# Patient Record
Sex: Female | Born: 1965 | Hispanic: No | Smoking: Never smoker
Health system: Southern US, Community
[De-identification: ages and names within clinical notes are randomized; demographics above are authoritative.]

## PROBLEM LIST (undated history)

## (undated) DIAGNOSIS — M5126 Other intervertebral disc displacement, lumbar region: Secondary | ICD-10-CM

## (undated) DIAGNOSIS — M5020 Other cervical disc displacement, unspecified cervical region: Secondary | ICD-10-CM

## (undated) DIAGNOSIS — M25811 Other specified joint disorders, right shoulder: Secondary | ICD-10-CM

## (undated) DIAGNOSIS — M51369 Other intervertebral disc degeneration, lumbar region without mention of lumbar back pain or lower extremity pain: Secondary | ICD-10-CM

## (undated) DIAGNOSIS — F329 Major depressive disorder, single episode, unspecified: Secondary | ICD-10-CM

## (undated) DIAGNOSIS — I10 Essential (primary) hypertension: Secondary | ICD-10-CM

## (undated) DIAGNOSIS — N950 Postmenopausal bleeding: Secondary | ICD-10-CM

## (undated) DIAGNOSIS — N9489 Other specified conditions associated with female genital organs and menstrual cycle: Secondary | ICD-10-CM

## (undated) DIAGNOSIS — F32A Depression, unspecified: Secondary | ICD-10-CM

## (undated) DIAGNOSIS — M1990 Unspecified osteoarthritis, unspecified site: Secondary | ICD-10-CM

## (undated) DIAGNOSIS — R9389 Abnormal findings on diagnostic imaging of other specified body structures: Secondary | ICD-10-CM

## (undated) DIAGNOSIS — F411 Generalized anxiety disorder: Secondary | ICD-10-CM

## (undated) DIAGNOSIS — F419 Anxiety disorder, unspecified: Secondary | ICD-10-CM

## (undated) HISTORY — DX: Depression, unspecified: F32.A

## (undated) HISTORY — PX: TUBAL LIGATION: SHX77

## (undated) HISTORY — PX: AUGMENTATION MAMMAPLASTY: SUR837

---

## 1990-02-17 HISTORY — PX: LAPAROSCOPIC TUBAL LIGATION: SUR803

## 2019-02-18 HISTORY — PX: AUGMENTATION MAMMAPLASTY: SUR837

## 2020-01-18 DEATH — deceased

## 2020-02-13 DIAGNOSIS — M7541 Impingement syndrome of right shoulder: Secondary | ICD-10-CM | POA: Insufficient documentation

## 2020-02-13 DIAGNOSIS — G5621 Lesion of ulnar nerve, right upper limb: Secondary | ICD-10-CM | POA: Insufficient documentation

## 2020-08-23 DIAGNOSIS — R5383 Other fatigue: Secondary | ICD-10-CM | POA: Diagnosis not present

## 2020-08-23 DIAGNOSIS — I10 Essential (primary) hypertension: Secondary | ICD-10-CM | POA: Diagnosis not present

## 2020-08-23 DIAGNOSIS — Z6827 Body mass index (BMI) 27.0-27.9, adult: Secondary | ICD-10-CM | POA: Diagnosis not present

## 2020-08-23 DIAGNOSIS — Z79899 Other long term (current) drug therapy: Secondary | ICD-10-CM | POA: Diagnosis not present

## 2020-08-23 DIAGNOSIS — Z131 Encounter for screening for diabetes mellitus: Secondary | ICD-10-CM | POA: Diagnosis not present

## 2020-08-23 DIAGNOSIS — Z13220 Encounter for screening for lipoid disorders: Secondary | ICD-10-CM | POA: Diagnosis not present

## 2020-08-23 DIAGNOSIS — M79601 Pain in right arm: Secondary | ICD-10-CM | POA: Diagnosis not present

## 2020-08-23 DIAGNOSIS — F4321 Adjustment disorder with depressed mood: Secondary | ICD-10-CM | POA: Diagnosis not present

## 2020-09-09 DIAGNOSIS — E559 Vitamin D deficiency, unspecified: Secondary | ICD-10-CM | POA: Diagnosis not present

## 2020-09-09 DIAGNOSIS — M47812 Spondylosis without myelopathy or radiculopathy, cervical region: Secondary | ICD-10-CM | POA: Diagnosis not present

## 2020-09-09 DIAGNOSIS — M129 Arthropathy, unspecified: Secondary | ICD-10-CM | POA: Diagnosis not present

## 2020-10-04 DIAGNOSIS — F324 Major depressive disorder, single episode, in partial remission: Secondary | ICD-10-CM | POA: Diagnosis not present

## 2020-10-04 DIAGNOSIS — Z79899 Other long term (current) drug therapy: Secondary | ICD-10-CM | POA: Diagnosis not present

## 2020-10-04 DIAGNOSIS — F411 Generalized anxiety disorder: Secondary | ICD-10-CM | POA: Diagnosis not present

## 2020-10-04 DIAGNOSIS — F410 Panic disorder [episodic paroxysmal anxiety] without agoraphobia: Secondary | ICD-10-CM | POA: Diagnosis not present

## 2020-10-04 DIAGNOSIS — F4321 Adjustment disorder with depressed mood: Secondary | ICD-10-CM | POA: Diagnosis not present

## 2021-03-11 ENCOUNTER — Ambulatory Visit
Admission: RE | Admit: 2021-03-11 | Discharge: 2021-03-11 | Disposition: A | Discharge status: Home / Self Care | Payer: BC Managed Care – PPO | Source: Ambulatory Visit | Attending: Specialist | Admitting: Specialist

## 2021-03-11 ENCOUNTER — Other Ambulatory Visit: Payer: Self-pay | Admitting: Specialist

## 2021-03-11 ENCOUNTER — Ambulatory Visit
Admission: RE | Admit: 2021-03-11 | Discharge: 2021-03-11 | Disposition: A | Discharge status: Home / Self Care | Payer: BC Managed Care – PPO | Attending: Specialist | Admitting: Specialist

## 2021-03-11 DIAGNOSIS — Z01810 Encounter for preprocedural cardiovascular examination: Secondary | ICD-10-CM | POA: Insufficient documentation

## 2021-03-11 DIAGNOSIS — Z01818 Encounter for other preprocedural examination: Secondary | ICD-10-CM | POA: Diagnosis not present

## 2021-03-11 DIAGNOSIS — I10 Essential (primary) hypertension: Secondary | ICD-10-CM | POA: Diagnosis not present

## 2021-05-10 DIAGNOSIS — Z0000 Encounter for general adult medical examination without abnormal findings: Secondary | ICD-10-CM | POA: Diagnosis not present

## 2021-05-10 DIAGNOSIS — Z780 Asymptomatic menopausal state: Secondary | ICD-10-CM | POA: Diagnosis not present

## 2021-05-10 DIAGNOSIS — J0101 Acute recurrent maxillary sinusitis: Secondary | ICD-10-CM | POA: Diagnosis not present

## 2021-05-10 DIAGNOSIS — F320 Major depressive disorder, single episode, mild: Secondary | ICD-10-CM | POA: Diagnosis not present

## 2021-05-23 DIAGNOSIS — F4323 Adjustment disorder with mixed anxiety and depressed mood: Secondary | ICD-10-CM | POA: Diagnosis not present

## 2021-06-13 DIAGNOSIS — F320 Major depressive disorder, single episode, mild: Secondary | ICD-10-CM | POA: Diagnosis not present

## 2021-06-13 DIAGNOSIS — F4323 Adjustment disorder with mixed anxiety and depressed mood: Secondary | ICD-10-CM | POA: Diagnosis not present

## 2021-07-08 DIAGNOSIS — F320 Major depressive disorder, single episode, mild: Secondary | ICD-10-CM | POA: Diagnosis not present

## 2021-07-08 DIAGNOSIS — F4323 Adjustment disorder with mixed anxiety and depressed mood: Secondary | ICD-10-CM | POA: Diagnosis not present

## 2021-07-23 ENCOUNTER — Ambulatory Visit (INDEPENDENT_AMBULATORY_CARE_PROVIDER_SITE_OTHER): Payer: Self-pay | Admitting: Plastic Surgery

## 2021-07-23 ENCOUNTER — Encounter: Payer: Self-pay | Admitting: Plastic Surgery

## 2021-07-23 DIAGNOSIS — Z719 Counseling, unspecified: Secondary | ICD-10-CM

## 2021-07-23 NOTE — Progress Notes (Signed)
     Patient ID: Christy Wilcox, female    DOB: May 20, 1965, 56 y.o.   MRN: [id number]   Chief Complaint  Patient presents with   Consult   Breast Problem    The patient is a 56 year old female here for evaluation of her breasts.  She underwent breast augmentation with reduction by Dr. Lucky Cowboy back in [place].  She had the surgery in February as far as she knows they are silicone implants but she is not sure about the size.  She went from a C cup to a D cup she thinks that they are partially under the muscle.  She had a mammogram in 2022 which was negative.  She is not having any issues she just wanted to make sure that everything looked okay.  She was a little concerned about the scars which seem to be a little bit thick.  This might be an indicator of hypertrophic scarring.  She looks to have good symmetry.   Review of Systems  Constitutional: Negative.   Eyes: Negative.   Respiratory: Negative.  Negative for chest tightness.   Cardiovascular: Negative.   Gastrointestinal: Negative.   Endocrine: Negative.   Genitourinary: Negative.   Musculoskeletal: Negative.   Skin: Negative.   Hematological: Negative.   Psychiatric/Behavioral: Negative.     History reviewed. No pertinent past medical history.  History reviewed. No pertinent surgical history.    Current Outpatient Medications:    amLODipine (NORVASC) 10 MG tablet, Take 10 mg by mouth daily., Disp: , Rfl:    sertraline (ZOLOFT) 50 MG tablet, Take by mouth., Disp: , Rfl:    valsartan-hydrochlorothiazide (DIOVAN-HCT) 320-12.5 MG tablet, Take 1 tablet by mouth daily., Disp: , Rfl:    Objective:   Vitals:   07/23/21 1424  BP: (!) 171/99  Pulse: 84  Resp: 16  Temp: 98.1 F (36.7 C)  SpO2: 100%    Physical Exam Vitals reviewed.  Constitutional:      Appearance: Normal appearance.  HENT:     Head: Atraumatic.  Cardiovascular:     Rate and Rhythm: Normal rate.     Pulses: Normal pulses.  Pulmonary:     Effort:  Pulmonary effort is normal. No respiratory distress.  Abdominal:     General: There is no distension.     Palpations: Abdomen is soft.     Tenderness: There is no abdominal tenderness.  Skin:    Capillary Refill: Capillary refill takes less than 2 seconds.     Coloration: Skin is not jaundiced.     Findings: No bruising.  Neurological:     Mental Status: She is alert and oriented to person, place, and time.  Psychiatric:        Mood and Affect: Mood normal.        Behavior: Behavior normal.        Thought Content: Thought content normal.        Judgment: Judgment normal.    Assessment & Plan:  Encounter for counseling  Recommend skinuva, massage and silicone sheeting for the scarring.  Follow-up as needed.  Pictures were obtained of the patient and placed in the chart with the patient's or guardian's permission.   [place], DO

## 2022-02-17 HISTORY — PX: COLONOSCOPY: SHX174

## 2022-03-26 ENCOUNTER — Other Ambulatory Visit: Payer: Self-pay | Admitting: Nurse Practitioner

## 2022-03-26 ENCOUNTER — Other Ambulatory Visit (HOSPITAL_COMMUNITY)
Admission: RE | Admit: 2022-03-26 | Discharge: 2022-03-26 | Disposition: A | Discharge status: Home / Self Care | Payer: BC Managed Care – PPO | Source: Ambulatory Visit | Attending: Nurse Practitioner | Admitting: Nurse Practitioner

## 2022-03-26 DIAGNOSIS — Z124 Encounter for screening for malignant neoplasm of cervix: Secondary | ICD-10-CM | POA: Insufficient documentation

## 2022-03-26 DIAGNOSIS — Z1231 Encounter for screening mammogram for malignant neoplasm of breast: Secondary | ICD-10-CM

## 2022-03-26 DIAGNOSIS — N898 Other specified noninflammatory disorders of vagina: Secondary | ICD-10-CM | POA: Diagnosis not present

## 2022-03-26 DIAGNOSIS — N950 Postmenopausal bleeding: Secondary | ICD-10-CM | POA: Diagnosis not present

## 2022-03-26 DIAGNOSIS — Z01419 Encounter for gynecological examination (general) (routine) without abnormal findings: Secondary | ICD-10-CM | POA: Diagnosis not present

## 2022-03-31 LAB — CYTOLOGY - PAP
Comment: NEGATIVE
Diagnosis: NEGATIVE
High risk HPV: NEGATIVE

## 2022-04-03 DIAGNOSIS — N858 Other specified noninflammatory disorders of uterus: Secondary | ICD-10-CM | POA: Diagnosis not present

## 2022-04-03 DIAGNOSIS — N950 Postmenopausal bleeding: Secondary | ICD-10-CM | POA: Diagnosis not present

## 2022-05-09 ENCOUNTER — Ambulatory Visit
Admission: RE | Admit: 2022-05-09 | Discharge: 2022-05-09 | Disposition: A | Discharge status: Home / Self Care | Payer: BC Managed Care – PPO | Source: Ambulatory Visit | Attending: Nurse Practitioner | Admitting: Nurse Practitioner

## 2022-05-09 DIAGNOSIS — Z1231 Encounter for screening mammogram for malignant neoplasm of breast: Secondary | ICD-10-CM

## 2022-05-20 ENCOUNTER — Other Ambulatory Visit: Payer: Self-pay | Admitting: Nurse Practitioner

## 2022-05-20 DIAGNOSIS — R928 Other abnormal and inconclusive findings on diagnostic imaging of breast: Secondary | ICD-10-CM

## 2022-06-06 ENCOUNTER — Encounter: Payer: Self-pay | Admitting: Nurse Practitioner

## 2022-06-06 ENCOUNTER — Ambulatory Visit
Admission: RE | Admit: 2022-06-06 | Discharge: 2022-06-06 | Disposition: A | Discharge status: Home / Self Care | Payer: BC Managed Care – PPO | Source: Ambulatory Visit | Attending: Nurse Practitioner | Admitting: Nurse Practitioner

## 2022-06-06 ENCOUNTER — Other Ambulatory Visit: Payer: Self-pay | Admitting: Nurse Practitioner

## 2022-06-06 DIAGNOSIS — N6489 Other specified disorders of breast: Secondary | ICD-10-CM

## 2022-06-06 DIAGNOSIS — R928 Other abnormal and inconclusive findings on diagnostic imaging of breast: Secondary | ICD-10-CM

## 2022-06-06 DIAGNOSIS — N6312 Unspecified lump in the right breast, upper inner quadrant: Secondary | ICD-10-CM | POA: Diagnosis not present

## 2022-06-16 DIAGNOSIS — N9489 Other specified conditions associated with female genital organs and menstrual cycle: Secondary | ICD-10-CM | POA: Diagnosis not present

## 2022-06-16 DIAGNOSIS — N950 Postmenopausal bleeding: Secondary | ICD-10-CM | POA: Diagnosis not present

## 2022-06-16 DIAGNOSIS — D259 Leiomyoma of uterus, unspecified: Secondary | ICD-10-CM | POA: Diagnosis not present

## 2022-06-16 DIAGNOSIS — Z8041 Family history of malignant neoplasm of ovary: Secondary | ICD-10-CM | POA: Diagnosis not present

## 2022-07-11 DIAGNOSIS — K648 Other hemorrhoids: Secondary | ICD-10-CM | POA: Diagnosis not present

## 2022-07-11 DIAGNOSIS — Z1211 Encounter for screening for malignant neoplasm of colon: Secondary | ICD-10-CM | POA: Diagnosis not present

## 2022-07-22 IMAGING — CR DG CHEST 2V
2 series · 2 of 2 positions shown · non-contrast
Comparison: None.

CLINICAL DATA: Pre-op clearance exam

EXAM:
CHEST - 2 VIEW

[chest pa]
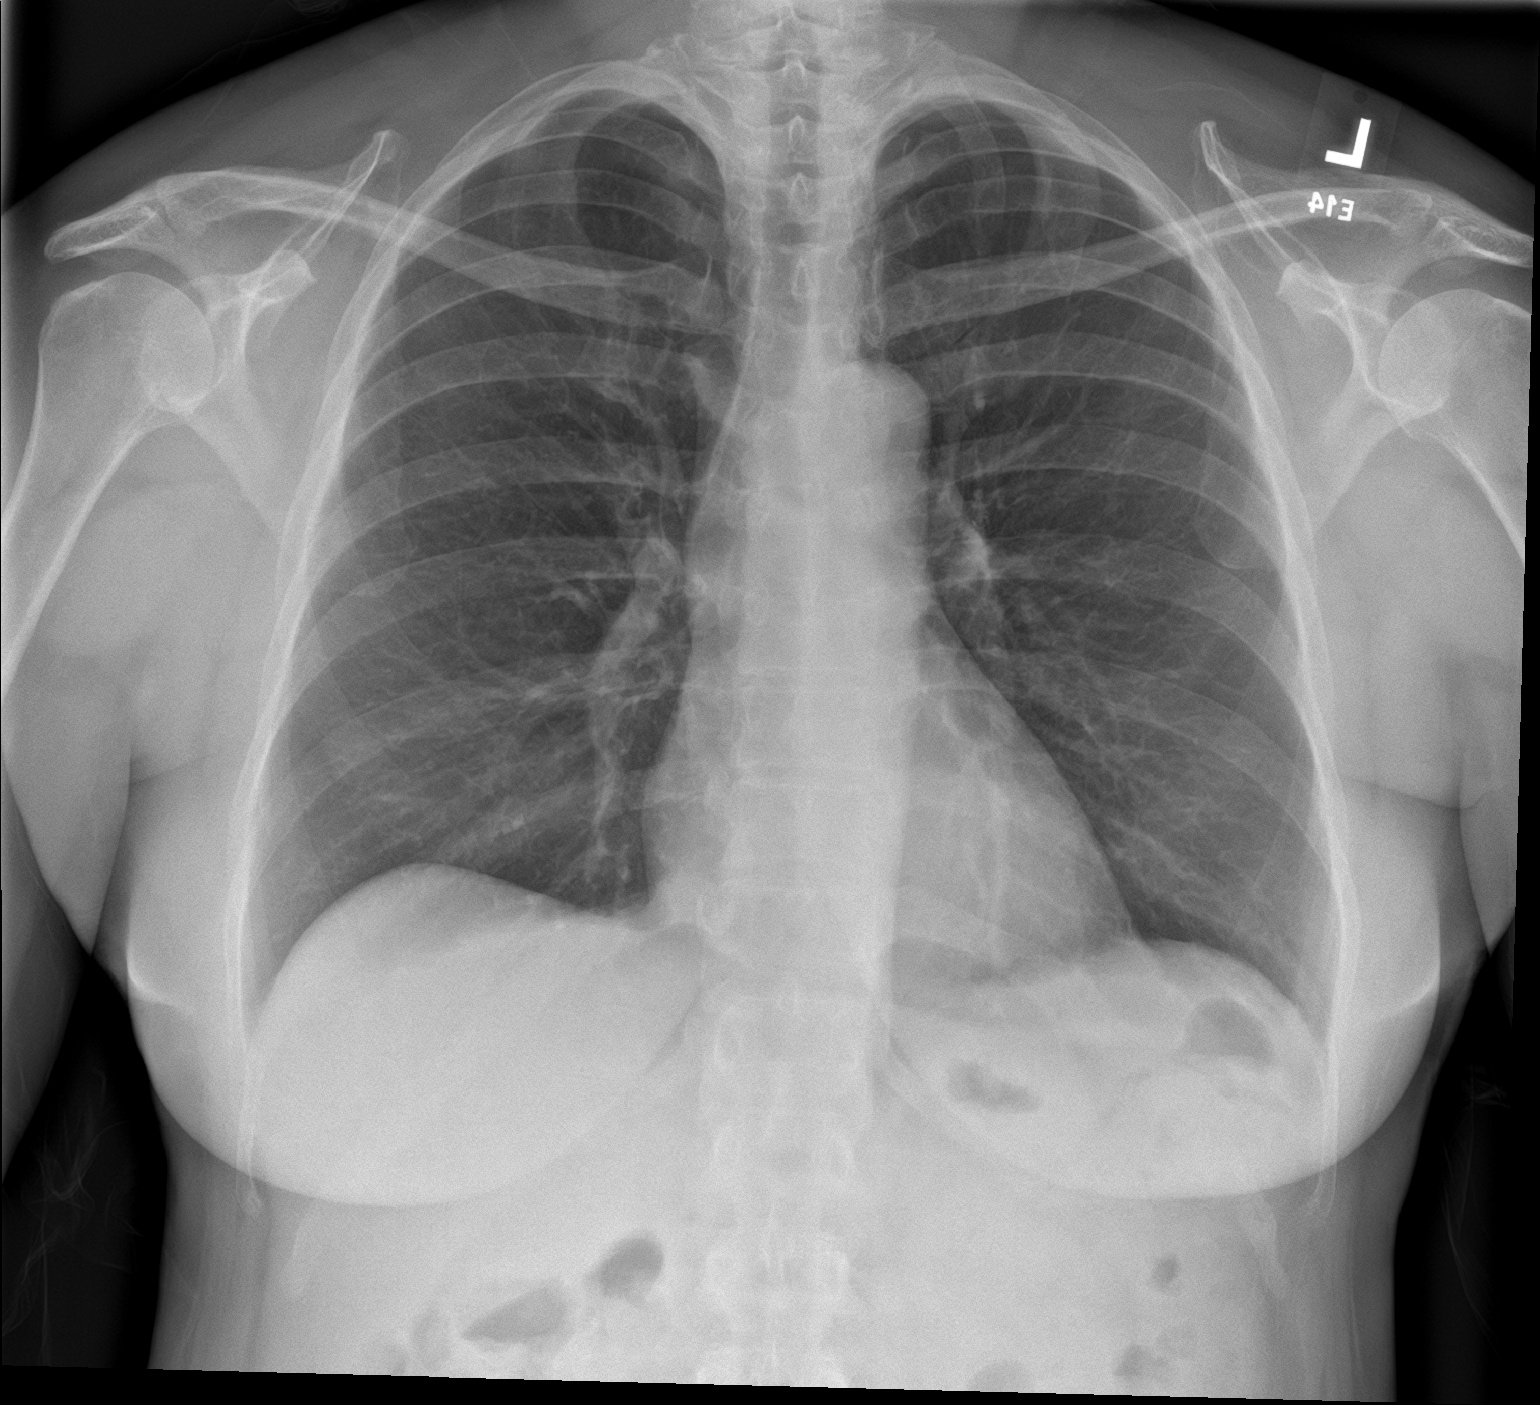

[chest lat]
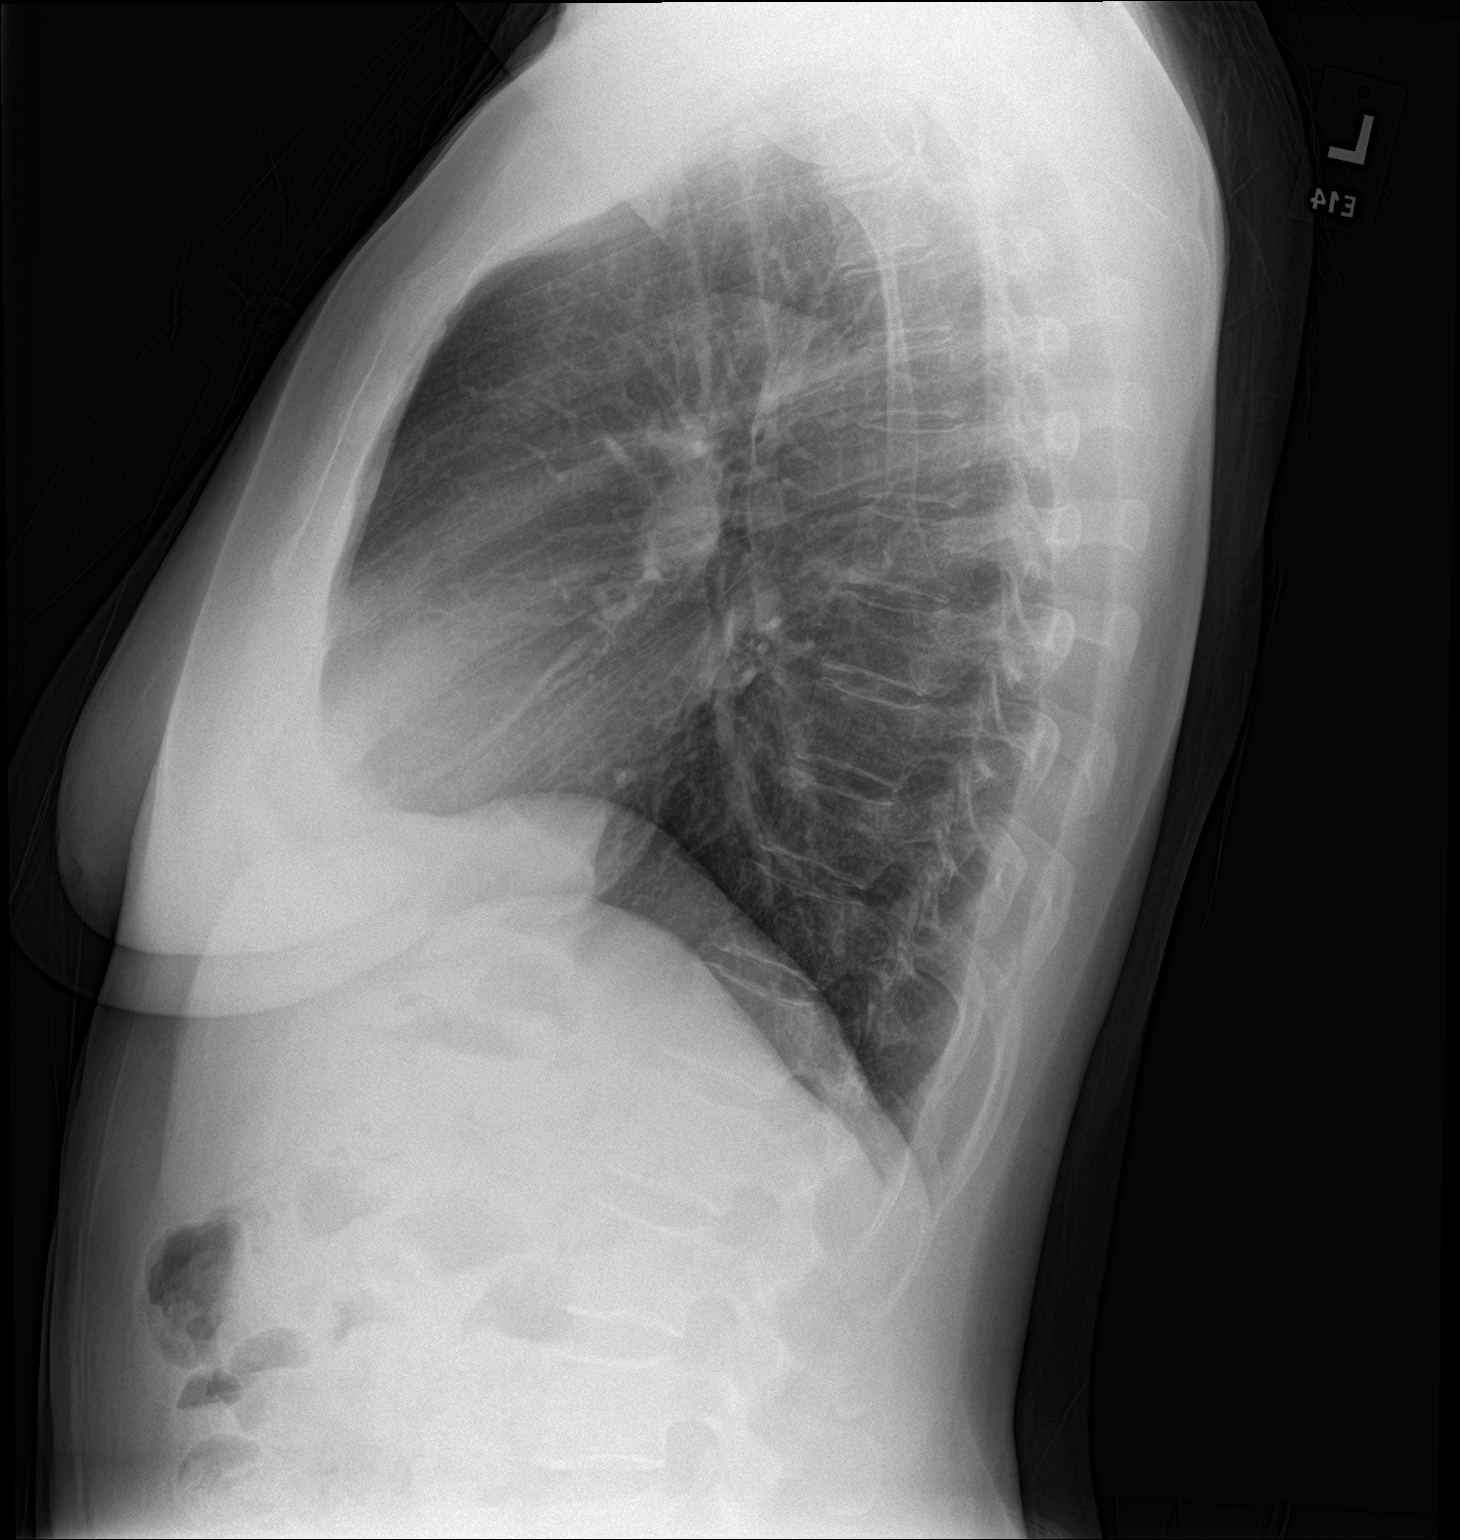

[2 of 2 positions shown; findings below may reference images not displayed]

FINDINGS: The heart size and mediastinal contours are within normal limits.
Both lungs are clear. The visualized skeletal structures are
unremarkable.
IMPRESSION: No active cardiopulmonary disease.

## 2022-08-24 DIAGNOSIS — N950 Postmenopausal bleeding: Secondary | ICD-10-CM | POA: Insufficient documentation

## 2022-08-24 DIAGNOSIS — N9489 Other specified conditions associated with female genital organs and menstrual cycle: Secondary | ICD-10-CM

## 2022-08-25 ENCOUNTER — Other Ambulatory Visit: Payer: Self-pay

## 2022-08-25 ENCOUNTER — Encounter (HOSPITAL_BASED_OUTPATIENT_CLINIC_OR_DEPARTMENT_OTHER): Payer: Self-pay | Admitting: Obstetrics and Gynecology

## 2022-08-25 NOTE — Progress Notes (Signed)
Spoke w/ via phone for pre-op interview---pt Lab needs dos---- cbc, t & s, I stat, ekg              Lab results------none COVID test -----patient states asymptomatic no test needed Arrive at -------530 am 09-10-2022 NPO after MN NO Solid Food.  Clear liquids from MN until---430 am Med rec completed Medications to take morning of surgery -----amlodipine, xanax prn Diabetic medication -----n/a Patient instructed no nail polish to be worn day of surgery Patient instructed to bring photo id and insurance card day of surgery Patient aware to have Driver (ride ) / caregiver   torie and jean sisters of patient  for 24 hours after surgery  Patient Special Instructions -----none Pre-Op special Instructions -----none Patient verbalized understanding of instructions that were given at this phone interview. Patient denies shortness of breath, chest pain, fever, cough at this phone interview.

## 2022-09-02 SURGERY — Surgical Case
Anesthesia: *Unknown

## 2022-09-10 ENCOUNTER — Ambulatory Visit (HOSPITAL_BASED_OUTPATIENT_CLINIC_OR_DEPARTMENT_OTHER)
Admission: RE | Admit: 2022-09-10 | Payer: BC Managed Care – PPO | Source: Home / Self Care | Admitting: Obstetrics and Gynecology

## 2022-09-10 DIAGNOSIS — N9489 Other specified conditions associated with female genital organs and menstrual cycle: Secondary | ICD-10-CM

## 2022-09-10 DIAGNOSIS — N950 Postmenopausal bleeding: Secondary | ICD-10-CM | POA: Insufficient documentation

## 2022-09-10 DIAGNOSIS — Z01818 Encounter for other preprocedural examination: Secondary | ICD-10-CM

## 2022-09-10 HISTORY — DX: Anxiety disorder, unspecified: F41.9

## 2022-09-10 HISTORY — DX: Other specified conditions associated with female genital organs and menstrual cycle: N94.89

## 2022-09-10 HISTORY — DX: Unspecified osteoarthritis, unspecified site: M19.90

## 2022-09-10 HISTORY — DX: Other intervertebral disc displacement, lumbar region: M51.26

## 2022-09-10 HISTORY — DX: Other cervical disc displacement, unspecified cervical region: M50.20

## 2022-09-10 SURGERY — DILATATION & CURETTAGE/HYSTEROSCOPY WITH MYOSURE
Anesthesia: Choice

## 2022-12-12 ENCOUNTER — Ambulatory Visit
Admission: RE | Admit: 2022-12-12 | Discharge: 2022-12-12 | Disposition: A | Discharge status: Home / Self Care | Payer: BC Managed Care – PPO | Source: Ambulatory Visit | Attending: Nurse Practitioner | Admitting: Nurse Practitioner

## 2022-12-12 DIAGNOSIS — N6489 Other specified disorders of breast: Secondary | ICD-10-CM

## 2022-12-12 DIAGNOSIS — R928 Other abnormal and inconclusive findings on diagnostic imaging of breast: Secondary | ICD-10-CM | POA: Diagnosis not present

## 2023-01-09 DIAGNOSIS — I10 Essential (primary) hypertension: Secondary | ICD-10-CM | POA: Diagnosis not present

## 2023-01-09 DIAGNOSIS — H538 Other visual disturbances: Secondary | ICD-10-CM | POA: Diagnosis not present

## 2023-01-09 DIAGNOSIS — R519 Headache, unspecified: Secondary | ICD-10-CM | POA: Diagnosis not present

## 2023-04-19 NOTE — Progress Notes (Unsigned)
 Psychiatric Initial Adult Assessment   Patient Identification: Christy Wilcox MRN:  [id number] Date of Evaluation:  04/21/2023 Referral Source: Dr. Kennieth Francois  Chief Complaint:   Chief Complaint  Patient presents with   Establish Care   Anxiety   Depression   Insomnia   Medication Refill   Visit Diagnosis:    ICD-10-CM   1. MDD (major depressive disorder), recurrent episode, moderate (HCC)  F33.1     2. GAD (generalized anxiety disorder)  F41.1       History of Present Illness:  Christy Wilcox is a 58 year old biracial female, employed, lives in [place], has a history of anxiety, hypertension, was evaluated in office today, presented for an initial psychiatric evaluation.  She experiences a significant increase in anxiety, exacerbated by various life stressors. Anxiety symptoms occur daily, including hyperventilation, palpitations, and racing thoughts, particularly when not busy at work. Triggers include family issues, such as her sister's cancer recurrence and strained relationships with siblings. She also reports memory issues, like forgetting conversations, and difficulty focusing, attributing these to anxiety.  She has a history of using Xanax for over ten years, finding it effective in calming anxiety and aiding sleep. Other medications like Zoloft and Buspar caused undesirable side effects, leading to ineffective management and referral.  She has a history of depression, particularly following her husband's death five years ago, which led to a move to [place] for a change of pace. Symptoms include feeling down, reduced appetite, difficulty focusing, hopeless, and experiencing sleep disturbances, waking every two hours.  She reports her depression symptoms as getting worse since the past 1 year or so.  She denies any suicidality.  Denies any homicidality or perceptual disturbances.  She denies any history of trauma.  She denies any obsessions or compulsive behaviors.   Denies any manic or hypomanic symptoms.  Patient appeared to be alert, oriented to person place time situation.  Reports although she does have memory issues it is mostly short-term memory problems and she is able to function okay on a day-to-day basis.     Associated Signs/Symptoms: Depression Symptoms:  depressed mood, anhedonia, insomnia, difficulty concentrating, anxiety, decreased appetite, (Hypo) Manic Symptoms:   Denies Anxiety Symptoms:  Excessive Worry, Psychotic Symptoms:   Denies PTSD Symptoms: Negative  Past Psychiatric History: Patient reports she used to be under the care of psychiatrist and therapist previously.  Most recently medications were tried by primary care provider.  Denies inpatient behavioral health admissions.  Denies suicide attempts.  Denies self-injurious behaviors.  Previous Psychotropic Medications: Yes Wellbutrin-did not take it too long, Zoloft-did not like it, BuSpar-did not like it, Xanax-helped her in the past  Substance Abuse History in the last 12 months:  No.  Consequences of Substance Abuse: Negative  Past Medical History:  Past Medical History:  Diagnosis Date   Anxiety    Arthritis right shoulder and back    Cervical herniated disc    Depression    Endometrial mass    Lumbar herniated disc     Past Surgical History:  Procedure Laterality Date   AUGMENTATION MAMMAPLASTY Bilateral    2021   TUBAL LIGATION     yrs ago    Family Psychiatric History: As noted below.  Family History:  Family History  Problem Relation Age of Onset   Anxiety disorder Mother    Drug abuse Sister    Bipolar disorder Sister    Schizophrenia Sister    Anxiety disorder Sister    Anxiety disorder Sister  Anxiety disorder Sister    Drug abuse Brother    Anxiety disorder Brother    Anxiety disorder Brother    Anxiety disorder Brother    Breast cancer Maternal Aunt    Schizophrenia Paternal Grandmother     Social History:   Social  History   Socioeconomic History   Marital status: Widowed    Spouse name: Not on file   Number of children: 3   Years of education: Not on file   Highest education level: 12th grade  Occupational History   Not on file  Tobacco Use   Smoking status: Never   Smokeless tobacco: Never  Vaping Use   Vaping status: Never Used  Substance and Sexual Activity   Alcohol use: Yes    Alcohol/week: 3.0 standard drinks of alcohol    Types: 3 Glasses of wine per week    Comment: occ wine   Drug use: Not Currently   Sexual activity: Not Currently  Other Topics Concern   Not on file  Social History Narrative   Not on file   Social Drivers of Health   Financial Resource Strain: Low Risk  (01/22/2023)   Received from [facility]   Overall Financial Resource Strain (CARDIA)    Difficulty of Paying Living Expenses: Not hard at all  Food Insecurity: No Food Insecurity (01/22/2023)   [facility] from [facility]   Hunger Vital Sign    Worried About Running Out of Food in the Last Year: Never true    Ran Out of Food in the Last Year: Never true  Transportation Needs: No Transportation Needs (01/22/2023)   Received from [facility]    In the past 12 months, has lack of transportation kept you from medical appointments or from getting medications?: No    Lack of Transportation (Non-Medical): No  Physical Activity: Not on file  Stress: Not on file  Social Connections: Not on file    Additional Social History: Patient was born in [place].  She grew up in [place].  She was raised primarily by mother, her father was also involved.  She has 6 siblings.  She went up to 12th grade.  She graduated high school.  Used to work as a Engineer, site previously.  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  She is widowed.  She moved to [place] after the death of her spouse.  She has 3 children  age 28, 73 and 59 who are still in [place].  Reports she has a good relationship.  She has sisters here in [place].  She believes in God.  She denies access to gun.  Denies legal problems.  Allergies:  No Known Allergies  Metabolic Disorder Labs: No results found for: "HGBA1C", "MPG" No results found for: "PROLACTIN" No results found for: "CHOL", "TRIG", "HDL", "CHOLHDL", "VLDL", "LDLCALC" No results found for: "TSH"  Therapeutic Level Labs: No results found for: "LITHIUM" No results found for: "CBMZ" No results found for: "VALPROATE"  Current Medications: Current Outpatient Medications  Medication Sig Dispense Refill   ALPRAZolam (XANAX) 0.5 MG tablet Take 0.5 mg by mouth as needed for anxiety.     amLODipine (NORVASC) 10 MG tablet Take 10 mg by mouth daily.     valsartan-hydrochlorothiazide (DIOVAN-HCT) 320-12.5 MG tablet Take 1 tablet by mouth as needed.     No current facility-administered medications for this visit.  Musculoskeletal: Strength & Muscle Tone: within normal limits Gait & Station: normal Patient leans: N/A  Psychiatric Specialty Exam: Review of Systems  Psychiatric/Behavioral:  Positive for dysphoric mood and sleep disturbance. The patient is nervous/anxious.     Blood pressure 120/82, pulse 93, temperature 98.6 F (37 C), temperature source Temporal, height 5\' 7"  (1.702 m), weight 172 lb 9.6 oz (78.3 kg), SpO2 95%.Body mass index is 27.03 kg/m.  General Appearance: Fairly Groomed  Eye Contact:  Fair  Speech:  Clear and Coherent  Volume:  Normal  Mood:  Anxious and Depressed  Affect:  Tearful  Thought Process:  Goal Directed and Descriptions of Associations: Intact  Orientation:  Full (Time, Place, and Person)  Thought Content:  Logical  Suicidal Thoughts:  No  Homicidal Thoughts:  No  Memory:  Immediate;   Fair Recent;   Fair Remote;   Poor  Judgement:  Other:  limited  Insight:  Shallow  Psychomotor Activity:  Normal   Concentration:  Concentration: Fair and Attention Span: Fair  Recall:  [employer] of Knowledge:Fair  Language: Fair  Akathisia:  No  Handed:  Right  AIMS (if indicated):  not done  Assets:  Desire for Improvement Housing Social Support Transportation  ADL's:  Intact  Cognition: WNL  Sleep:  Poor   Screenings: GAD-7    Loss adjuster, chartered Office Visit from [facility]/21/2023 in [facility]  Total GAD-7 Score 18      PHQ2-9    Flowsheet Row Office Visit from 04/21/2023 in [facility]  PHQ-2 Total Score 6  PHQ-9 Total Score 18      Flowsheet Row Office Visit from 04/21/2023 in [facility]  C-SSRS RISK CATEGORY No Risk       Assessment and Plan: Angelena Sand is a 58 year old biracial female, employed, lives in [place], presented to establish care.  Discussed assessment and plan as noted below.  Generalized Anxiety Disorder-unstable Significant exacerbation of anxiety symptoms ongoing for approximately a year, with daily episodes triggered by stressors such as family issues and personal losses. Symptoms include hyperventilation, palpitations, and difficulty concentrating. Family history of anxiety and other mental health disorders. Long-term use of Xanax, which is effective but not recommended due to potential dependency and masking symptoms. SSRIs or SNRIs suggested as first-line treatments, with tricyclic antidepressants as alternatives if side effects occur. Strong preference for Xanax, but informed of its potential for dependency and lack of efficacy in treating the underlying condition.   - Refer to therapist for ongoing therapy  -patient agrees to reach out to previous therapist[facility]. - Discussed non-controlled substance options for anxiety management-patient declines. - Patient not interested in trial of any medication today other than  Xanax.  Depression-unstable Depression following the death of her husband five years ago. Symptoms include lack of interest, feelings of hopelessness, and sleep disturbances. Previous adverse effects from Zoloft and Wellbutrin. SSRIs or SNRIs recommended as first-line treatments, with tricyclic antidepressants as alternatives if side effects occur. Advised against using Xanax due to potential dependency and lack of efficacy in treating depression.   - Refer to therapist for ongoing therapy   - Discussed SSRI/SNRI , other antidepressants.  Patient declines.   Follow-up   Advised to follow up with a therapist for ongoing therapy . - Follow up with therapist for ongoing therapy   - Patient not interested in scheduling a follow-up at this time.  Follow-up as needed.   Collaboration of  Care: Patient refused AEB patient not interested in establishing care with our therapist.  She reports she will contact her previous therapist.  She is not interested in scheduling a follow-up either.  Patient/Guardian was advised Release of Information must be obtained prior to any record release in order to collaborate their care with an outside provider. Patient/Guardian was advised if they have not already done so to contact the registration department to sign all necessary forms in order for [place] to release information regarding their care.   Consent: Patient/Guardian gives verbal consent for treatment and assignment of benefits for services provided during this visit. Patient/Guardian expressed understanding and agreed to proceed.  Discussed the use of a AI scribe software for clinical note transcription with the patient, who gave verbal consent to proceed. This note was generated in part or whole with voice recognition software. Voice recognition is usually quite accurate but there are transcription errors that can and very often do occur. I apologize for any typographical errors that were not detected and  corrected.     Jomarie Longs, MD 3/5/202512:54 PM

## 2023-04-21 ENCOUNTER — Encounter: Payer: Self-pay | Admitting: Psychiatry

## 2023-04-21 ENCOUNTER — Ambulatory Visit (INDEPENDENT_AMBULATORY_CARE_PROVIDER_SITE_OTHER): Payer: Self-pay | Admitting: Psychiatry

## 2023-04-21 VITALS — BP 120/82 | HR 93 | Temp 98.6°F | Ht 67.0 in | Wt 172.6 lb

## 2023-04-21 DIAGNOSIS — F331 Major depressive disorder, recurrent, moderate: Secondary | ICD-10-CM | POA: Diagnosis not present

## 2023-04-21 DIAGNOSIS — F411 Generalized anxiety disorder: Secondary | ICD-10-CM | POA: Insufficient documentation

## 2023-05-13 ENCOUNTER — Ambulatory Visit (HOSPITAL_COMMUNITY): Admit: 2023-05-13 | Admitting: Obstetrics and Gynecology

## 2023-05-13 DIAGNOSIS — N9489 Other specified conditions associated with female genital organs and menstrual cycle: Secondary | ICD-10-CM

## 2023-05-13 DIAGNOSIS — N950 Postmenopausal bleeding: Secondary | ICD-10-CM

## 2023-05-13 SURGERY — POLYPECTOMY, CERVIX
Anesthesia: Choice

## 2023-06-03 ENCOUNTER — Other Ambulatory Visit: Payer: Self-pay | Admitting: Internal Medicine

## 2023-06-03 DIAGNOSIS — R140 Abdominal distension (gaseous): Secondary | ICD-10-CM

## 2023-07-22 NOTE — H&P (Signed)
 Christy Wilcox is an 58 y.o. postmenopausal G6P3 who is admitted for Hysteroscopy with Dilation and Curettage with Myosure polypectomy for PMB, thickened endometrium, and endometrial mass on ultrasound.  Has not had any recurrent PMB. Reports some cramping pain.  Work-up: 04/03/2022 EMB: Atrophic endometrium with breakdown. No hyperplasia or malignancy identified.  03/26/2022 Pap smear: NILM/HRHPV negative  04/03/2022 TVUS Uterus: 8.32 x 4.11 x 4.82 cm Endometrial thickness: 0.76 cm Fibroid 2.68 cm  Fibroid 2 3.74 cm  Fibroid 3 1.38 cm  Fibroid 4 2.46 cm  Fibroid 5 0.83 cm  Fibroid 6 1.35 cm Right Ovary 2.16 cm  Left Ovary 2.47 cm Comments:Anteverted uterus. Uterine fibroids- fundal 2.7 x 2.7 x 2.3 cm, post pedunculated 4.3 x 3.7 x 2.8 cm, posterior 1.4 x 1.2 x 0.9 cm, posterior 2.5 x 2.5 x 1.9 cm, anterior 1.2 x 0.8 x 0.7 cm, left 1.4 x 1.3 x 1.0 cm.Endometrium thickened with ? Hyperechoic mass- 1.0 x 0.7 cm, ? Submucosal fibroid. Bilateral ovaries WNL. No adnexal masses seen.  Patient Active Problem List   Diagnosis Date Noted   MDD (major depressive disorder), recurrent episode, moderate (HCC) 04/21/2023   GAD (generalized anxiety disorder) 04/21/2023   Postmenopausal bleeding 08/24/2022   Endometrial mass 08/24/2022   Encounter for counseling 07/23/2021   Impingement syndrome of right shoulder region 02/13/2020   Ulnar neuropathy at elbow, right 02/13/2020    MEDICAL/FAMILY/SOCIAL HX: No LMP recorded. Patient is postmenopausal.    Past Medical History:  Diagnosis Date   Anxiety    Arthritis right shoulder and back    Cervical herniated disc    Depression    Endometrial mass    Lumbar herniated disc     Past Surgical History:  Procedure Laterality Date   AUGMENTATION MAMMAPLASTY Bilateral    2021   TUBAL LIGATION     yrs ago    Family History  Problem Relation Age of Onset   Anxiety disorder Mother    Drug abuse Sister    Bipolar disorder Sister     Schizophrenia Sister    Anxiety disorder Sister    Anxiety disorder Sister    Anxiety disorder Sister    Drug abuse Brother    Anxiety disorder Brother    Anxiety disorder Brother    Anxiety disorder Brother    Breast cancer Maternal Aunt    Schizophrenia Paternal Grandmother     Social History:  reports that she has never smoked. She has never used smokeless tobacco. She reports current alcohol use of about 3.0 standard drinks of alcohol per week. She reports that she does not currently use drugs.  ALLERGIES/MEDS:  Allergies: No Known Allergies  No medications prior to admission.     Review of Systems  All other systems reviewed and are negative.   There were no vitals taken for this visit. Gen:  NAD, pleasant and cooperative Cardio:  RRR Pulm:  CTAB, no wheezes/rales/rhonchi Abd:  Soft, non-distended, non-tender throughout, no rebound/guarding Ext:  No bilateral LE edema, no bilateral calf tenderness  No results found for this or any previous visit (from the past 24 hours).  No results found.   ASSESSMENT/PLAN: Christy Wilcox is a 58 y.o. postmenopausal G6P3 who is admitted for Hysteroscopy with Dilation and Curettage with Myosure polypectomy for PMB, thickened endometrium, and endometrial mass on ultrasound.  - Admit to [facility] OR - Admit labs (CBC, T&S) - Diet:  Per anesthesia - IVF:  Per anesthesia - VTE Prophylaxis:  SCDs - Antibiotics:  None - D/C home same-day  Consents: I discussed with the patient that this surgery is performed to look inside the uterus and remove the uterine lining.  Prior to surgery, the risks and benefits of the surgery, as well as alternative treatments, have been discussed.  The risks include, but are not limited to bleeding, including the need for a blood transfusion, infection, damage to organs and tissues, including uterine perforation, requiring additional surgery, postoperative pain, short-term and long-term, failure of the procedure  to control symptoms, need for hysterectomy to control bleeding, fluid overload, which could create electrolyte abnormalities and the need to stop the procedure before completion, inability to safely complete the procedure, deep vein thrombosis and/or pulmonary embolism, painful intercourse, complications the course of which cannot be predicted or prevented, and death.  Patient was consented for blood products.  The patient is aware that bleeding may result in the need for a blood transfusion which includes risk of transmission of HIV (1:2 million), Hepatitis C (1:2 million), and Hepatitis B (1:200 thousand) and transfusion reaction.  Patient voiced understanding of the above risks as well as understanding of indications for blood transfusion.   Raneen Jaffer, DO

## 2023-07-27 ENCOUNTER — Encounter (HOSPITAL_COMMUNITY): Payer: Self-pay | Admitting: Obstetrics and Gynecology

## 2023-07-29 ENCOUNTER — Encounter (HOSPITAL_COMMUNITY): Payer: Self-pay | Admitting: Obstetrics and Gynecology

## 2023-07-29 NOTE — Progress Notes (Signed)
 Spoke w/ via phone for pre-op interview--- pt Lab needs dos----  cbc, t&s, istat, ekg       Lab results------ no COVID test -----patient states asymptomatic no test needed Arrive at ------- 0530 on 08-04-2023 NPO after MN w/ exception sips of water w/ meds Pre-Surgery Ensure or G2: n/a  Med rec completed Medications to take morning of surgery ----- if needed xanax Diabetic medication ----- n/a  GLP1 agonist last dose: n/a GLP1 instructions:  Patient instructed no nail polish to be worn day of surgery Patient instructed to bring photo id and insurance card day of surgery Patient aware to have Driver (ride ) / caregiver    for 24 hours after surgery - daughter, mabe waittle Patient Special Instructions ----- antibacterial soap shower morning of surgery Pre-Op special Instructions ----- n/a  Patient verbalized understanding of instructions that were given at this phone interview. Patient denies chest pain, sob, fever, cough at the interview.

## 2023-08-03 ENCOUNTER — Encounter (HOSPITAL_COMMUNITY): Payer: Self-pay | Admitting: Obstetrics and Gynecology

## 2023-08-03 NOTE — Anesthesia Preprocedure Evaluation (Signed)
 Anesthesia Evaluation  Patient identified by MRN, date of birth, ID band Patient awake    Reviewed: Allergy & Precautions, NPO status , Patient's Chart, lab work & pertinent test results  Airway Mallampati: II  TM Distance: >3 FB     Dental  (+) Teeth Intact, Dental Advisory Given   Pulmonary neg pulmonary ROS   Pulmonary exam normal breath sounds clear to auscultation       Cardiovascular hypertension, Pt. on medications Normal cardiovascular exam Rhythm:Regular Rate:Normal     Neuro/Psych  PSYCHIATRIC DISORDERS Anxiety Depression    Right ulnar neuropathy  Neuromuscular disease    GI/Hepatic negative GI ROS, Neg liver ROS,,,  Endo/Other  negative endocrine ROS    Renal/GU negative Renal ROS  negative genitourinary   Musculoskeletal  (+) Arthritis , Osteoarthritis,  Impingement syndrome right shoulder HNP Cervical and lumbar spine   Abdominal   Peds  Hematology negative hematology ROS (+)   Anesthesia Other Findings   Reproductive/Obstetrics PMB Endometrial mass                              Anesthesia Physical Anesthesia Plan  ASA: 2  Anesthesia Plan: General   Post-op Pain Management: Minimal or no pain anticipated, Precedex and Tylenol PO (pre-op)*   Induction: Intravenous  PONV Risk Score and Plan: 4 or greater and Treatment may vary due to age or medical condition, Ondansetron, Dexamethasone and Midazolam  Airway Management Planned: LMA  Additional Equipment: None  Intra-op Plan:   Post-operative Plan: Extubation in OR  Informed Consent: I have reviewed the patients History and Physical, chart, labs and discussed the procedure including the risks, benefits and alternatives for the proposed anesthesia with the patient or authorized representative who has indicated his/her understanding and acceptance.     Dental advisory given  Plan Discussed with: CRNA and  Anesthesiologist  Anesthesia Plan Comments:          Anesthesia Quick Evaluation

## 2023-08-04 ENCOUNTER — Ambulatory Visit (HOSPITAL_COMMUNITY)
Admission: RE | Admit: 2023-08-04 | Discharge: 2023-08-04 | Disposition: A | Discharge status: Home / Self Care | Attending: Obstetrics and Gynecology | Admitting: Obstetrics and Gynecology

## 2023-08-04 ENCOUNTER — Encounter (HOSPITAL_COMMUNITY): Payer: Self-pay | Admitting: Obstetrics and Gynecology

## 2023-08-04 ENCOUNTER — Ambulatory Visit (HOSPITAL_COMMUNITY): Admitting: Anesthesiology

## 2023-08-04 ENCOUNTER — Other Ambulatory Visit: Payer: Self-pay

## 2023-08-04 ENCOUNTER — Encounter (HOSPITAL_COMMUNITY)
Admission: RE | Disposition: A | Discharge status: Home / Self Care | Payer: Self-pay | Source: Home / Self Care | Attending: Obstetrics and Gynecology

## 2023-08-04 DIAGNOSIS — N9489 Other specified conditions associated with female genital organs and menstrual cycle: Secondary | ICD-10-CM | POA: Diagnosis present

## 2023-08-04 DIAGNOSIS — N840 Polyp of corpus uteri: Secondary | ICD-10-CM

## 2023-08-04 DIAGNOSIS — N950 Postmenopausal bleeding: Secondary | ICD-10-CM | POA: Diagnosis not present

## 2023-08-04 DIAGNOSIS — R9389 Abnormal findings on diagnostic imaging of other specified body structures: Secondary | ICD-10-CM | POA: Insufficient documentation

## 2023-08-04 DIAGNOSIS — I10 Essential (primary) hypertension: Secondary | ICD-10-CM | POA: Insufficient documentation

## 2023-08-04 DIAGNOSIS — Z01818 Encounter for other preprocedural examination: Secondary | ICD-10-CM

## 2023-08-04 HISTORY — DX: Other intervertebral disc degeneration, lumbar region without mention of lumbar back pain or lower extremity pain: M51.369

## 2023-08-04 HISTORY — DX: Abnormal findings on diagnostic imaging of other specified body structures: R93.89

## 2023-08-04 HISTORY — DX: Other specified joint disorders, right shoulder: M25.811

## 2023-08-04 HISTORY — PX: HYSTEROSCOPY: SHX211

## 2023-08-04 HISTORY — DX: Major depressive disorder, single episode, unspecified: F32.9

## 2023-08-04 HISTORY — DX: Postmenopausal bleeding: N95.0

## 2023-08-04 HISTORY — DX: Essential (primary) hypertension: I10

## 2023-08-04 HISTORY — DX: Generalized anxiety disorder: F41.1

## 2023-08-04 LAB — POCT I-STAT, CHEM 8
BUN: 17 mg/dL (ref 6–20)
Calcium, Ion: 1.13 mmol/L — ABNORMAL LOW (ref 1.15–1.40)
Chloride: 107 mmol/L (ref 98–111)
Creatinine, Ser: 1 mg/dL (ref 0.44–1.00)
Glucose, Bld: 113 mg/dL — ABNORMAL HIGH (ref 70–99)
HCT: 39 % (ref 36.0–46.0)
Hemoglobin: 13.3 g/dL (ref 12.0–15.0)
Potassium: 3.4 mmol/L — ABNORMAL LOW (ref 3.5–5.1)
Sodium: 142 mmol/L (ref 135–145)
TCO2: 23 mmol/L (ref 22–32)

## 2023-08-04 LAB — CBC
HCT: 40.7 % (ref 36.0–46.0)
Hemoglobin: 13.7 g/dL (ref 12.0–15.0)
MCH: 30.9 pg (ref 26.0–34.0)
MCHC: 33.7 g/dL (ref 30.0–36.0)
MCV: 91.9 fL (ref 80.0–100.0)
Platelets: 232 10*3/uL (ref 150–400)
RBC: 4.43 MIL/uL (ref 3.87–5.11)
RDW: 13.3 % (ref 11.5–15.5)
WBC: 8.2 10*3/uL (ref 4.0–10.5)
nRBC: 0 % (ref 0.0–0.2)

## 2023-08-04 LAB — TYPE AND SCREEN
ABO/RH(D): O POS
Antibody Screen: NEGATIVE

## 2023-08-04 LAB — ABO/RH: ABO/RH(D): O POS

## 2023-08-04 SURGERY — HYSTEROSCOPY
Anesthesia: General | Site: Uterus

## 2023-08-04 MED ORDER — SODIUM CHLORIDE (PF) 0.9 % IJ SOLN
INTRAMUSCULAR | Status: AC
  Filled 2023-08-04: qty 10

## 2023-08-04 MED ORDER — ONDANSETRON HCL 4 MG/2ML IJ SOLN
INTRAMUSCULAR | Status: DC | PRN
  Administered 2023-08-04: 4 mg via INTRAVENOUS

## 2023-08-04 MED ORDER — OXYCODONE HCL 5 MG PO TABS: 5.0000 mg | ORAL_TABLET | Freq: Once | ORAL | Status: DC | PRN

## 2023-08-04 MED ORDER — LACTATED RINGERS IV SOLN: INTRAVENOUS | Status: DC

## 2023-08-04 MED ORDER — ONDANSETRON HCL 4 MG/2ML IJ SOLN: 4.0000 mg | Freq: Once | INTRAMUSCULAR | Status: DC | PRN

## 2023-08-04 MED ORDER — KETOROLAC TROMETHAMINE 30 MG/ML IJ SOLN
INTRAMUSCULAR | Status: AC
  Filled 2023-08-04: qty 1

## 2023-08-04 MED ORDER — DROPERIDOL 2.5 MG/ML IJ SOLN: 0.6250 mg | Freq: Once | INTRAMUSCULAR | Status: DC | PRN

## 2023-08-04 MED ORDER — MIDAZOLAM HCL 2 MG/2ML IJ SOLN
INTRAMUSCULAR | Status: AC
Start: 2023-08-04 — End: 2023-08-04
  Filled 2023-08-04: qty 2

## 2023-08-04 MED ORDER — PHENYLEPHRINE 80 MCG/ML (10ML) SYRINGE FOR IV PUSH (FOR BLOOD PRESSURE SUPPORT)
PREFILLED_SYRINGE | INTRAVENOUS | Status: DC | PRN
  Administered 2023-08-04: 40 ug via INTRAVENOUS

## 2023-08-04 MED ORDER — ORAL CARE MOUTH RINSE: 15.0000 mL | Freq: Once | OROMUCOSAL | Status: AC

## 2023-08-04 MED ORDER — CHLORHEXIDINE GLUCONATE 0.12 % MT SOLN
15.0000 mL | Freq: Once | OROMUCOSAL | Status: AC
  Administered 2023-08-04: 15 mL via OROMUCOSAL

## 2023-08-04 MED ORDER — FENTANYL CITRATE (PF) 100 MCG/2ML IJ SOLN: 25.0000 ug | INTRAMUSCULAR | Status: DC | PRN

## 2023-08-04 MED ORDER — DEXAMETHASONE SODIUM PHOSPHATE 10 MG/ML IJ SOLN
INTRAMUSCULAR | Status: AC
Start: 2023-08-04 — End: 2023-08-04
  Filled 2023-08-04: qty 1

## 2023-08-04 MED ORDER — SILVER NITRATE-POT NITRATE 75-25 % EX MISC
CUTANEOUS | Status: DC | PRN
  Administered 2023-08-04: 4

## 2023-08-04 MED ORDER — LIDOCAINE 2% (20 MG/ML) 5 ML SYRINGE
INTRAMUSCULAR | Status: DC | PRN
  Administered 2023-08-04: 80 mg via INTRAVENOUS

## 2023-08-04 MED ORDER — FENTANYL CITRATE (PF) 100 MCG/2ML IJ SOLN
INTRAMUSCULAR | Status: AC
  Filled 2023-08-04: qty 4

## 2023-08-04 MED ORDER — ONDANSETRON HCL 4 MG/2ML IJ SOLN
INTRAMUSCULAR | Status: AC
  Filled 2023-08-04: qty 2

## 2023-08-04 MED ORDER — KETOROLAC TROMETHAMINE 30 MG/ML IJ SOLN
INTRAMUSCULAR | Status: AC
Start: 2023-08-04 — End: 2023-08-04
  Filled 2023-08-04: qty 1

## 2023-08-04 MED ORDER — DEXAMETHASONE SODIUM PHOSPHATE 10 MG/ML IJ SOLN
INTRAMUSCULAR | Status: DC | PRN
  Administered 2023-08-04: 10 mg via INTRAVENOUS

## 2023-08-04 MED ORDER — FENTANYL CITRATE (PF) 100 MCG/2ML IJ SOLN
INTRAMUSCULAR | Status: DC | PRN
  Administered 2023-08-04 (×2): 50 ug via INTRAVENOUS

## 2023-08-04 MED ORDER — MIDAZOLAM HCL 2 MG/2ML IJ SOLN
INTRAMUSCULAR | Status: DC | PRN
Start: 2023-08-04 — End: 2023-08-04
  Administered 2023-08-04: 2 mg via INTRAVENOUS

## 2023-08-04 MED ORDER — IBUPROFEN 800 MG PO TABS: 800.0000 mg | ORAL_TABLET | Freq: Three times a day (TID) | ORAL | 1 refills | Status: AC | PRN

## 2023-08-04 MED ORDER — PROPOFOL 10 MG/ML IV BOLUS
INTRAVENOUS | Status: DC | PRN
Start: 2023-08-04 — End: 2023-08-04
  Administered 2023-08-04: 160 mg via INTRAVENOUS

## 2023-08-04 MED ORDER — OXYCODONE HCL 5 MG/5ML PO SOLN: 5.0000 mg | Freq: Once | ORAL | Status: DC | PRN

## 2023-08-04 MED ORDER — LIDOCAINE 2% (20 MG/ML) 5 ML SYRINGE
INTRAMUSCULAR | Status: AC
  Filled 2023-08-04: qty 5

## 2023-08-04 MED ORDER — CHLORHEXIDINE GLUCONATE 0.12 % MT SOLN
OROMUCOSAL | Status: AC
  Filled 2023-08-04: qty 15

## 2023-08-04 MED ORDER — FENTANYL CITRATE (PF) 100 MCG/2ML IJ SOLN
INTRAMUSCULAR | Status: AC
  Filled 2023-08-04: qty 2

## 2023-08-04 MED ORDER — KETOROLAC TROMETHAMINE 30 MG/ML IJ SOLN
INTRAMUSCULAR | Status: DC | PRN
  Administered 2023-08-04: 30 mg via INTRAVENOUS

## 2023-08-04 MED ORDER — PHENYLEPHRINE 80 MCG/ML (10ML) SYRINGE FOR IV PUSH (FOR BLOOD PRESSURE SUPPORT)
PREFILLED_SYRINGE | INTRAVENOUS | Status: AC
  Filled 2023-08-04: qty 10

## 2023-08-04 MED ORDER — SODIUM CHLORIDE 0.9 % IR SOLN
Status: DC | PRN
  Administered 2023-08-04: 3000 mL

## 2023-08-04 MED ORDER — PROPOFOL 10 MG/ML IV BOLUS
INTRAVENOUS | Status: AC
Start: 2023-08-04 — End: 2023-08-04
  Filled 2023-08-04: qty 20

## 2023-08-04 SURGICAL SUPPLY — 16 items
CATH ROBINSON RED A/P 16FR (CATHETERS) IMPLANT
CNTNR URN SCR LID CUP LEK RST (MISCELLANEOUS) ×1 IMPLANT
COVER MAYO STAND STRL (DRAPES) ×1 IMPLANT
DEVICE MYOSURE LITE (MISCELLANEOUS) IMPLANT
DEVICE MYOSURE REACH (MISCELLANEOUS) IMPLANT
DILATOR CANAL MILEX (MISCELLANEOUS) IMPLANT
GLOVE BIO SURGEON STRL SZ 6.5 (GLOVE) ×1 IMPLANT
GLOVE BIOGEL PI IND STRL 7.0 (GLOVE) ×2 IMPLANT
GOWN STRL REUS W/ TWL LRG LVL3 (GOWN DISPOSABLE) ×2 IMPLANT
KIT PROCEDURE FLUENT (KITS) ×1 IMPLANT
KIT TURNOVER KIT B (KITS) ×1 IMPLANT
PACK VAGINAL MINOR WOMEN LF (CUSTOM PROCEDURE TRAY) ×1 IMPLANT
PAD OB MATERNITY 11 LF (PERSONAL CARE ITEMS) ×1 IMPLANT
SEAL ROD LENS SCOPE MYOSURE (ABLATOR) ×1 IMPLANT
TOWEL GREEN STERILE FF (TOWEL DISPOSABLE) ×1 IMPLANT
UNDERPAD 30X36 HEAVY ABSORB (UNDERPADS AND DIAPERS) ×1 IMPLANT

## 2023-08-04 NOTE — Interval H&P Note (Signed)
 History and Physical Interval Note:  08/04/2023 7:35 AM  Christy Wilcox  has presented today for surgery, with the diagnosis of Endometrial Mass, Postmenopausal bleeding.  The various methods of treatment have been discussed with the patient and family. After consideration of risks, benefits and other options for treatment, the patient has consented to  Procedure(s): HYSTEROSCOPY (N/A) with Myosure as a surgical intervention.  The patient's history has been reviewed, patient examined, no change in status, stable for surgery.  I have reviewed the patient's chart and labs.  Questions were answered to the patient's satisfaction.     Sheril Hammond

## 2023-08-04 NOTE — Anesthesia Procedure Notes (Signed)
 Procedure Name: LMA Insertion Date/Time: 08/04/2023 7:48 AM  Performed by: Trenton Frock, CRNAPre-anesthesia Checklist: Patient identified, Emergency Drugs available, Suction available and Patient being monitored Patient Re-evaluated:Patient Re-evaluated prior to induction Oxygen Delivery Method: Circle System Utilized Preoxygenation: Pre-oxygenation with 100% oxygen Induction Type: IV induction Ventilation: Mask ventilation without difficulty LMA: LMA inserted LMA Size: 4.0 Tube type: Oral Number of attempts: 1 Airway Equipment and Method: Bite block Placement Confirmation: positive ETCO2 and breath sounds checked- equal and bilateral Tube secured with: Tape Dental Injury: Teeth and Oropharynx as per pre-operative assessment

## 2023-08-04 NOTE — Op Note (Signed)
 Pre Op Dx:   1. Postmenopausal bleeding 2. Thickened endometrium on ultrasound (0.76cm) 3. Endometrial mass  Post Op Dx:   1. Postmenopausal bleeding 2. Thickened endometrium on ultrasound (0.76cm) 3. Endometrial polyp  Procedure:   Hysteroscopy with Dilation and Curettage with Mysoure polypectomy   Surgeon:  Dr. Meldon Sport Assistants:  None Anesthesia:  General   EBL:  5cc  IVF:  See anesthesia documentation UOP:  Voided prior to arrival to OR Fluid Deficit:  135cc   Drains:  None Specimen removed:  Endometrial curettings and endometrial polyp - sent to pathology Device(s) implanted: None Case Type:  Clean-contaminated Findings:  Normal-appearing ectocervix and endocervical canal. Broad-based polyp at the fundus and proliferative endometrium throughout. Left tubal ostia clearly visualized, unable to visualize right tubal ostia. Uterus sounded to 6cm pre and post procedure. Complications: None Indications:  58 y.o. postmenopausal G6P3 with PMB, thickened endometrium on ultrasound (0.76cm) and endometrial mass.  Description of each procedure:  After informed consent was obtained the patient was taken to the operating room in the dorsal supine position.  After administration of general anesthesia, the patient was placed in the dorsal lithotomy position and prepped and draped in the usual sterile fashion.  A pre-operative time-out was completed.  The anterior lip of the cervix was grasped with a single-tooth tenaculum and the cervix was serially dilated to accommodate the hysteroscope.  The hysteroscope was advanced and the findings as above was noted. The Myosure Reach was used to resect the endometrial polyp and endometrium in its entirety. A sharp banjo curette was used to curettage the endometrium. The single-tooth tenaculum was removed and its sites were made hemostatic with silver nitrate and pressure.  Adequate hemostasis was noted.  The patient was awakened and extubated and  appeared to have tolerated the procedure well.  All counts were correct.  Disposition:  PACU  Meldon Sport, DO

## 2023-08-04 NOTE — Anesthesia Postprocedure Evaluation (Signed)
 Anesthesia Post Note  Patient: Christy Wilcox  Procedure(s) Performed: HYSTEROSCOPY/MYOSURE RESECTION (Uterus)     Patient location during evaluation: PACU Anesthesia Type: General Level of consciousness: awake and alert and oriented Pain management: pain level controlled Vital Signs Assessment: post-procedure vital signs reviewed and stable Respiratory status: spontaneous breathing, nonlabored ventilation and respiratory function stable Cardiovascular status: blood pressure returned to baseline and stable Postop Assessment: no apparent nausea or vomiting Anesthetic complications: no   No notable events documented.  Last Vitals:  Vitals:   08/04/23 0900 08/04/23 0915  BP: (!) 137/95   Pulse: 63 63  Resp: 10 11  Temp:    SpO2: 100% 97%    Last Pain:  Vitals:   08/04/23 0828  TempSrc:   PainSc: 0-No pain                 Deshanta Lady A.

## 2023-08-04 NOTE — Discharge Instructions (Signed)

## 2023-08-04 NOTE — Transfer of Care (Signed)
 Immediate Anesthesia Transfer of Care Note  Patient: Christy Wilcox  Procedure(s) Performed: HYSTEROSCOPY/MYOSURE RESECTION (Uterus)  Patient Location: PACU  Anesthesia Type:General  Level of Consciousness: oriented and patient cooperative  Airway & Oxygen Therapy: Patient Spontanous Breathing  Post-op Assessment: Report given to RN, Post -op Vital signs reviewed and stable, Patient moving all extremities X 4, and Patient able to stick tongue midline  Post vital signs: Reviewed and stable  Last Vitals:  Vitals Value Taken Time  BP 137/81   Temp 97.8   Pulse 85 08/04/23 08:28  Resp 16 08/04/23 08:28  SpO2 100 % 08/04/23 08:28  Vitals shown include unfiled device data.  Last Pain:  Vitals:   08/04/23 0610  TempSrc: Oral  PainSc: 0-No pain      Patients Stated Pain Goal: 7 (08/04/23 0610)  Complications: No notable events documented.

## 2023-08-05 ENCOUNTER — Encounter (HOSPITAL_COMMUNITY): Payer: Self-pay | Admitting: Obstetrics and Gynecology

## 2023-08-05 LAB — SURGICAL PATHOLOGY

## 2024-02-05 ENCOUNTER — Other Ambulatory Visit: Payer: Self-pay | Admitting: Internal Medicine

## 2024-02-05 DIAGNOSIS — Z1231 Encounter for screening mammogram for malignant neoplasm of breast: Secondary | ICD-10-CM

## 2024-02-29 ENCOUNTER — Ambulatory Visit
Admission: RE | Admit: 2024-02-29 | Discharge: 2024-02-29 | Disposition: A | Source: Ambulatory Visit | Attending: Internal Medicine | Admitting: Internal Medicine

## 2024-02-29 DIAGNOSIS — Z1231 Encounter for screening mammogram for malignant neoplasm of breast: Secondary | ICD-10-CM
